# Patient Record
Sex: Male | Born: 1940 | Race: White | Hispanic: No | Marital: Married | Smoking: Never smoker
Health system: Southern US, Community
[De-identification: ages and names within clinical notes are randomized; demographics above are authoritative.]

## PROBLEM LIST (undated history)

## (undated) DIAGNOSIS — E119 Type 2 diabetes mellitus without complications: Secondary | ICD-10-CM

## (undated) DIAGNOSIS — G20 Parkinson's disease: Secondary | ICD-10-CM

## (undated) DIAGNOSIS — G20A1 Parkinson's disease without dyskinesia, without mention of fluctuations: Secondary | ICD-10-CM

---

## 2006-11-02 ENCOUNTER — Ambulatory Visit: Payer: Self-pay | Admitting: Gastroenterology

## 2006-11-06 ENCOUNTER — Observation Stay: Payer: Self-pay | Admitting: Internal Medicine

## 2006-11-06 ENCOUNTER — Other Ambulatory Visit: Payer: Self-pay

## 2007-10-22 ENCOUNTER — Ambulatory Visit: Payer: Self-pay | Admitting: Internal Medicine

## 2008-01-03 ENCOUNTER — Ambulatory Visit: Payer: Self-pay | Admitting: Internal Medicine

## 2008-09-06 ENCOUNTER — Ambulatory Visit: Payer: Self-pay | Admitting: Internal Medicine

## 2009-01-15 ENCOUNTER — Ambulatory Visit: Payer: Self-pay | Admitting: Ophthalmology

## 2009-03-28 ENCOUNTER — Inpatient Hospital Stay: Payer: Self-pay | Admitting: Internal Medicine

## 2009-06-17 ENCOUNTER — Emergency Department: Payer: Self-pay | Admitting: Emergency Medicine

## 2009-07-01 ENCOUNTER — Emergency Department: Payer: Self-pay | Admitting: Emergency Medicine

## 2010-04-04 ENCOUNTER — Ambulatory Visit: Payer: Self-pay | Admitting: Internal Medicine

## 2011-02-19 ENCOUNTER — Ambulatory Visit: Payer: Self-pay | Admitting: Internal Medicine

## 2011-05-16 ENCOUNTER — Ambulatory Visit: Payer: Self-pay | Admitting: Family Medicine

## 2012-12-05 ENCOUNTER — Ambulatory Visit: Payer: Self-pay | Admitting: Gastroenterology

## 2016-07-22 DIAGNOSIS — D239 Other benign neoplasm of skin, unspecified: Secondary | ICD-10-CM

## 2016-07-22 HISTORY — DX: Other benign neoplasm of skin, unspecified: D23.9

## 2018-04-21 ENCOUNTER — Emergency Department: Payer: Medicare HMO

## 2018-04-21 ENCOUNTER — Other Ambulatory Visit: Payer: Self-pay

## 2018-04-21 ENCOUNTER — Emergency Department
Admission: EM | Admit: 2018-04-21 | Discharge: 2018-04-21 | Disposition: A | Discharge status: Home / Self Care | Payer: Medicare HMO | Attending: Emergency Medicine | Admitting: Emergency Medicine

## 2018-04-21 DIAGNOSIS — R531 Weakness: Secondary | ICD-10-CM | POA: Insufficient documentation

## 2018-04-21 DIAGNOSIS — G20 Parkinson's disease: Secondary | ICD-10-CM | POA: Insufficient documentation

## 2018-04-21 HISTORY — DX: Parkinson's disease: G20

## 2018-04-21 HISTORY — DX: Parkinson's disease without dyskinesia, without mention of fluctuations: G20.A1

## 2018-04-21 LAB — URINALYSIS, COMPLETE (UACMP) WITH MICROSCOPIC
BACTERIA UA: NONE SEEN
BILIRUBIN URINE: NEGATIVE
Glucose, UA: NEGATIVE mg/dL
HGB URINE DIPSTICK: NEGATIVE
Ketones, ur: 5 mg/dL — AB
LEUKOCYTES UA: NEGATIVE
NITRITE: NEGATIVE
Protein, ur: NEGATIVE mg/dL
SPECIFIC GRAVITY, URINE: 1.023 (ref 1.005–1.030)
Squamous Epithelial / LPF: NONE SEEN (ref 0–5)
pH: 5 (ref 5.0–8.0)

## 2018-04-21 LAB — CBC
HCT: 42.9 % (ref 40.0–52.0)
HEMOGLOBIN: 15.1 g/dL (ref 13.0–18.0)
MCH: 30.9 pg (ref 26.0–34.0)
MCHC: 35.1 g/dL (ref 32.0–36.0)
MCV: 88 fL (ref 80.0–100.0)
Platelets: 181 10*3/uL (ref 150–440)
RBC: 4.88 MIL/uL (ref 4.40–5.90)
RDW: 13.7 % (ref 11.5–14.5)
WBC: 8.6 10*3/uL (ref 3.8–10.6)

## 2018-04-21 LAB — BASIC METABOLIC PANEL
ANION GAP: 6 (ref 5–15)
BUN: 18 mg/dL (ref 8–23)
CO2: 28 mmol/L (ref 22–32)
Calcium: 9.3 mg/dL (ref 8.9–10.3)
Chloride: 111 mmol/L (ref 98–111)
Creatinine, Ser: 0.83 mg/dL (ref 0.61–1.24)
Glucose, Bld: 98 mg/dL (ref 70–99)
Potassium: 3.6 mmol/L (ref 3.5–5.1)
SODIUM: 145 mmol/L (ref 135–145)

## 2018-04-21 LAB — TROPONIN I

## 2018-04-21 MED ORDER — SODIUM CHLORIDE 0.9 % IV BOLUS
500.0000 mL | Freq: Once | INTRAVENOUS | Status: AC
Start: 2018-04-21 — End: 2018-04-21
  Administered 2018-04-21: 500 mL via INTRAVENOUS

## 2018-04-21 NOTE — ED Notes (Signed)
Pt provided with ginger ale per request by family.

## 2018-04-21 NOTE — ED Triage Notes (Signed)
Pt arrives via [facility] from home c/o weakness and fatigue. Protocol initiated.

## 2018-04-21 NOTE — ED Notes (Signed)
Several attempts to call [facility] to inform of pt pending return, answering machine only.

## 2018-04-21 NOTE — ED Notes (Signed)
Vascular surgeon bedside at this time

## 2018-04-21 NOTE — ED Provider Notes (Addendum)
[facility] Emergency Department Provider Note  ____________________________________________   I have reviewed the triage vital signs and the nursing notes. Where available I have reviewed prior notes and, if possible and indicated, outside hospital notes.    HISTORY  Chief Complaint Weakness    HPI George Guerra is a 77 y.o. male  History of Parkinson's he has had gradual increase in his parkinsonian symptoms over the last few months, states he felt somewhat weak over the lunchtime.  He feels better now.  No focal weakness.  His review of systems is completely negative otherwise, no chest pain no shortness of breath no nausea no vomiting no abdominal pain no diarrhea no fall no head injury no focal numbness or weakness no stiff neck no abdominal pain no cough no change in medications no supplements no other complaints at all he just felt somewhat depleted today.  His strong preference is to go home.     Past Medical History:  Diagnosis Date  . Parkinson's disease ([place])     There are no active problems to display for this patient.   History reviewed. No pertinent surgical history.  Prior to Admission medications   Not on File    Allergies Patient has no known allergies.  History reviewed. No pertinent family history.  Social History Social History   Tobacco Use  . Smoking status: Never Smoker  . Smokeless tobacco: Never Used  Substance Use Topics  . Alcohol use: Never    Frequency: Never  . Drug use: Never    Review of Systems Constitutional: No fever/chills Eyes: No visual changes. ENT: No sore throat. No stiff neck no neck pain Cardiovascular: Denies chest pain. Respiratory: Denies shortness of breath. Gastrointestinal:   no vomiting.  No diarrhea.  No constipation. Genitourinary: Negative for dysuria. Musculoskeletal: Negative lower extremity swelling Skin: Negative for rash. Neurological: Negative for severe headaches, focal  weakness or numbness.   ____________________________________________   PHYSICAL EXAM:  VITAL SIGNS: ED Triage Vitals  Enc Vitals Group     BP 04/21/18 1617 (!) 160/83     Pulse Rate 04/21/18 1617 76     Resp 04/21/18 1617 16     Temp 04/21/18 1613 98.2 F (36.8 C)     Temp Source 04/21/18 1613 Oral     SpO2 04/21/18 1617 96 %     Weight 04/21/18 1613 180 lb (81.6 kg)     Height 04/21/18 1613 6' (1.829 m)     Head Circumference --      Peak Flow --      Pain Score 04/21/18 1613 0     Pain Loc --      Pain Edu? --      Excl. in [place]? --     Constitutional: Alert and oriented.  Mildly ill-appearing however in no acute distress. Eyes: Conjunctivae are normal Head: Atraumatic HEENT: No congestion/rhinnorhea. Mucous membranes are moist.  Oropharynx non-erythematous Neck:   Nontender with no meningismus, no masses, no stridor Cardiovascular: Normal rate, regular rhythm. Grossly normal heart sounds.  Good peripheral circulation. Respiratory: Normal respiratory effort.  No retractions. Lungs CTAB. Abdominal: Soft and nontender. No distention. No guarding no rebound Back:  There is no focal tenderness or step off.  there is no midline tenderness there are no lesions noted. there is no CVA tenderness Musculoskeletal: No lower extremity tenderness, no upper extremity tenderness. No joint effusions, no DVT signs strong distal pulses no edema Neurologic:  Normal speech and language. No  gross focal neurologic deficits are appreciated.  Skin:  Skin is warm, dry and intact. No rash noted. Psychiatric: Mood and affect are normal. Speech and behavior are normal.  ____________________________________________   LABS (all labs ordered are listed, but only abnormal results are displayed)  Labs Reviewed  URINALYSIS, COMPLETE (UACMP) WITH MICROSCOPIC - Abnormal; Notable for the following components:      Result Value   Color, Urine YELLOW (*)    APPearance CLEAR (*)    Ketones, ur 5 (*)     All other components within normal limits  BASIC METABOLIC PANEL  CBC    Pertinent labs  results that were available during my care of the patient were reviewed by me and considered in my medical decision making (see chart for details). ____________________________________________  EKG  I personally interpreted any EKGs ordered by me or triage Sinus rhythm rate 62 bpm no acute ST elevation or depression normal axis, nonspecific ST changes no acute ischemia.  Flip is in the inferior leads appear consistent with old. ____________________________________________  RADIOLOGY  Pertinent labs & imaging results that were available during my care of the patient were reviewed by me and considered in my medical decision making (see chart for details). If possible, patient and/or family made aware of any abnormal findings.  Dg Chest 2 View  Result Date: 04/21/2018 CLINICAL DATA:  Weakness, fatigue, Parkinson's disease EXAM: CHEST - 2 VIEW COMPARISON:  03/28/2009 FINDINGS: Normal heart size, mediastinal contours, and pulmonary vascularity. Atherosclerotic calcification aorta. Lungs clear. No pleural effusion or pneumothorax. Bones demineralized. IMPRESSION: No acute abnormalities. Electronically Signed   By: Lavonia Dana M.D.   On: 04/21/2018 17:45   Ct Head Wo Contrast  Result Date: 04/21/2018 CLINICAL DATA:  Weakness, fatigue EXAM: CT HEAD WITHOUT CONTRAST TECHNIQUE: Contiguous axial images were obtained from the base of the skull through the vertex without intravenous contrast. COMPARISON:  02/19/2011 FINDINGS: Brain: No acute territorial infarction, hemorrhage or intracranial mass. 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 Moderate atrophy. Mild small vessel ischemic changes of the white matter. Vascular: No hyperdense vessels.  Carotid vascular calcification Skull: Normal. Negative for fracture or focal lesion. Sinuses/Orbits: Near complete opacification of  right sphenoid sinus. No acute orbital abnormality Other: None IMPRESSION: 1. No CT evidence for acute intracranial abnormality. 2. Atrophy with small vessel ischemic changes of the white matter. 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   On: 04/21/2018 18:01   ____________________________________________    PROCEDURES  Procedure(s) performed: None  Procedures  Critical Care performed: None  ____________________________________________   INITIAL IMPRESSION / ASSESSMENT AND PLAN / ED COURSE  Pertinent labs & imaging results that were available during my care of the patient were reviewed by me and considered in my medical decision making (see chart for details).  Pt with no complaints no chest pain no shortness of breath no nausea no vomiting, just felt somewhat depleted today.  I think that if he did have chest pain he would know it, he certainly is  with it, he is alert he is oriented to the name date and year, and he is in no acute distress.  We will try to ambulate him.  EKG shows likely chronic changes troponin is pending, urinalysis and all the other work-up is negative including vital signs CT had chest x-ray.  If troponin is negative I think patient likely can  safely go home if he can ambulate we will try to ambulate him at this time.  Is requesting discharge and family feel strongly this would be the best thing for him.  We did discuss admission but they would rather go home.  I think this is not unreasonable.    ____________________________________________   FINAL CLINICAL IMPRESSION(S) / ED DIAGNOSES  Final diagnoses:  None      This chart was dictated using voice recognition software.  Despite best efforts to proofread,  errors can occur which can change meaning.      Schuyler Amor, MD 04/21/18 Remonia Richter    Schuyler Amor, MD 04/21/18 [phone]

## 2019-09-11 ENCOUNTER — Ambulatory Visit: Payer: Medicare HMO | Admitting: Neurology

## 2019-10-17 ENCOUNTER — Ambulatory Visit: Payer: Medicare HMO | Admitting: Neurology

## 2019-11-17 ENCOUNTER — Other Ambulatory Visit
Admission: RE | Admit: 2019-11-17 | Discharge: 2019-11-17 | Disposition: A | Discharge status: Home / Self Care | Payer: Medicare HMO | Source: Ambulatory Visit | Attending: Physician Assistant | Admitting: Physician Assistant

## 2019-11-17 ENCOUNTER — Other Ambulatory Visit: Payer: Self-pay

## 2019-11-17 ENCOUNTER — Ambulatory Visit
Admission: RE | Admit: 2019-11-17 | Discharge: 2019-11-17 | Disposition: A | Discharge status: Home / Self Care | Payer: Medicare HMO | Source: Ambulatory Visit | Attending: Physician Assistant | Admitting: Physician Assistant

## 2019-11-17 ENCOUNTER — Other Ambulatory Visit (HOSPITAL_COMMUNITY): Payer: Self-pay | Admitting: Physician Assistant

## 2019-11-17 ENCOUNTER — Other Ambulatory Visit: Payer: Self-pay | Admitting: Physician Assistant

## 2019-11-17 DIAGNOSIS — R55 Syncope and collapse: Secondary | ICD-10-CM

## 2019-11-17 DIAGNOSIS — R4781 Slurred speech: Secondary | ICD-10-CM

## 2019-11-17 LAB — TROPONIN I (HIGH SENSITIVITY): Troponin I (High Sensitivity): 9 ng/L (ref <18)

## 2020-01-18 ENCOUNTER — Ambulatory Visit: Payer: Medicare HMO | Admitting: Dermatology

## 2020-01-18 ENCOUNTER — Other Ambulatory Visit: Payer: Self-pay

## 2020-01-18 DIAGNOSIS — D229 Melanocytic nevi, unspecified: Secondary | ICD-10-CM

## 2020-01-18 DIAGNOSIS — L570 Actinic keratosis: Secondary | ICD-10-CM

## 2020-01-18 DIAGNOSIS — L820 Inflamed seborrheic keratosis: Secondary | ICD-10-CM

## 2020-01-18 DIAGNOSIS — L821 Other seborrheic keratosis: Secondary | ICD-10-CM | POA: Diagnosis not present

## 2020-01-18 DIAGNOSIS — Z1283 Encounter for screening for malignant neoplasm of skin: Secondary | ICD-10-CM | POA: Diagnosis not present

## 2020-01-18 DIAGNOSIS — L578 Other skin changes due to chronic exposure to nonionizing radiation: Secondary | ICD-10-CM

## 2020-01-18 DIAGNOSIS — D1800 Hemangioma unspecified site: Secondary | ICD-10-CM | POA: Diagnosis not present

## 2020-01-18 DIAGNOSIS — L814 Other melanin hyperpigmentation: Secondary | ICD-10-CM

## 2020-01-18 DIAGNOSIS — Z86018 Personal history of other benign neoplasm: Secondary | ICD-10-CM

## 2020-01-18 NOTE — Progress Notes (Signed)
   Follow-Up Visit   Subjective  George Guerra is a 79 y.o. male who presents for the following: Annual Exam (UBSE today - History of dysplastic nevus of left lat infrapectoral - excised 08/18/2016).  Patient presents for upper body skin exam for skin cancer screening and mole check.  The following portions of the chart were reviewed this encounter and updated as appropriate:  Tobacco  Allergies  Meds  Problems  Med Hx  Surg Hx  Fam Hx      Review of Systems:  No other skin or systemic complaints except as noted in HPI or Assessment and Plan.  Objective  Well appearing patient in no apparent distress; mood and affect are within normal limits.  All skin waist up examined.  Objective  Right lat brow: Erythematous thin papules/macules with gritty scale.   Objective  Left lat infrapectoral: Scar with no evidence of recurrence.   Objective  Right top of shoulder (4): Erythematous keratotic or waxy stuck-on papule or plaque.    Assessment & Plan    Skin cancer screening performed today.  Actinic Damage - diffuse scaly erythematous macules with underlying dyspigmentation - Recommend daily broad spectrum sunscreen SPF 30+ to sun-exposed areas, reapply every 2 hours as needed.  - Call for new or changing lesions.  Lentigines - Scattered tan macules - Discussed due to sun exposure - Benign, observe - Call for any changes  Seborrheic Keratoses - Stuck-on, waxy, tan-brown papules and plaques  - Discussed benign etiology and prognosis. - Observe - Call for any changes  Hemangiomas - Red papules - Discussed benign nature - Observe - Call for any changes  Melanocytic Nevi - Tan-brown and/or pink-flesh-colored symmetric macules and papules - Benign appearing on exam today - Observation - Call clinic for new or changing moles - Recommend daily use of broad spectrum spf 30+ sunscreen to sun-exposed areas.    AK (actinic keratosis) Right lat brow  Destruction  of lesion - Right lat brow Complexity: simple   Destruction method: cryotherapy   Informed consent: discussed and consent obtained   Timeout:  patient name, date of birth, surgical site, and procedure verified Lesion destroyed using liquid nitrogen: Yes   Region frozen until ice ball extended beyond lesion: Yes   Outcome: patient tolerated procedure well with no complications   Post-procedure details: wound care instructions given    History of dysplastic nevus Left lat infrapectoral  Clear today. Observe   Inflamed seborrheic keratosis (4) Right top of shoulder  Destruction of lesion - Right top of shoulder Complexity: simple   Destruction method: cryotherapy   Informed consent: discussed and consent obtained   Timeout:  patient name, date of birth, surgical site, and procedure verified Lesion destroyed using liquid nitrogen: Yes   Region frozen until ice ball extended beyond lesion: Yes   Outcome: patient tolerated procedure well with no complications   Post-procedure details: wound care instructions given    Return in about 1 year (around 01/17/2021).   I, Ashok Cordia, CMA, am acting as scribe for Sarina Ser, MD .     Documentation: I have reviewed the above documentation for accuracy and completeness, and I agree with the above.  Sarina Ser, MD

## 2020-01-18 NOTE — Patient Instructions (Signed)

## 2020-01-19 ENCOUNTER — Encounter: Payer: Self-pay | Admitting: Dermatology

## 2020-04-19 ENCOUNTER — Other Ambulatory Visit
Admission: RE | Admit: 2020-04-19 | Discharge: 2020-04-19 | Disposition: A | Discharge status: Home / Self Care | Payer: Medicare HMO | Source: Ambulatory Visit | Attending: Infectious Diseases | Admitting: Infectious Diseases

## 2020-04-19 DIAGNOSIS — R0602 Shortness of breath: Secondary | ICD-10-CM | POA: Diagnosis not present

## 2020-04-19 DIAGNOSIS — I10 Essential (primary) hypertension: Secondary | ICD-10-CM | POA: Diagnosis not present

## 2020-04-19 DIAGNOSIS — I483 Typical atrial flutter: Secondary | ICD-10-CM | POA: Insufficient documentation

## 2020-04-19 DIAGNOSIS — G20 Parkinson's disease: Secondary | ICD-10-CM | POA: Diagnosis not present

## 2020-04-19 DIAGNOSIS — R55 Syncope and collapse: Secondary | ICD-10-CM | POA: Diagnosis not present

## 2020-04-19 LAB — BRAIN NATRIURETIC PEPTIDE: B Natriuretic Peptide: 72.4 pg/mL (ref 0.0–100.0)

## 2020-05-20 ENCOUNTER — Emergency Department
Admission: EM | Admit: 2020-05-20 | Discharge: 2020-05-20 | Disposition: A | Discharge status: Home / Self Care | Payer: Medicare HMO | Attending: Emergency Medicine | Admitting: Emergency Medicine

## 2020-05-20 ENCOUNTER — Other Ambulatory Visit: Payer: Self-pay

## 2020-05-20 ENCOUNTER — Encounter: Payer: Self-pay | Admitting: Emergency Medicine

## 2020-05-20 DIAGNOSIS — R0602 Shortness of breath: Secondary | ICD-10-CM | POA: Insufficient documentation

## 2020-05-20 DIAGNOSIS — Z5321 Procedure and treatment not carried out due to patient leaving prior to being seen by health care provider: Secondary | ICD-10-CM | POA: Insufficient documentation

## 2020-05-20 HISTORY — DX: Type 2 diabetes mellitus without complications: E11.9

## 2020-05-20 LAB — CBC WITH DIFFERENTIAL/PLATELET
Abs Immature Granulocytes: 0.05 10*3/uL (ref 0.00–0.07)
Basophils Absolute: 0.1 10*3/uL (ref 0.0–0.1)
Basophils Relative: 1 %
Eosinophils Absolute: 0.1 10*3/uL (ref 0.0–0.5)
Eosinophils Relative: 2 %
HCT: 47.5 % (ref 39.0–52.0)
Hemoglobin: 16.5 g/dL (ref 13.0–17.0)
Immature Granulocytes: 1 %
Lymphocytes Relative: 21 %
Lymphs Abs: 2 10*3/uL (ref 0.7–4.0)
MCH: 30.5 pg (ref 26.0–34.0)
MCHC: 34.7 g/dL (ref 30.0–36.0)
MCV: 87.8 fL (ref 80.0–100.0)
Monocytes Absolute: 0.7 10*3/uL (ref 0.1–1.0)
Monocytes Relative: 7 %
Neutro Abs: 6.4 10*3/uL (ref 1.7–7.7)
Neutrophils Relative %: 68 %
Platelets: 183 10*3/uL (ref 150–400)
RBC: 5.41 MIL/uL (ref 4.22–5.81)
RDW: 12.8 % (ref 11.5–15.5)
WBC: 9.3 10*3/uL (ref 4.0–10.5)
nRBC: 0 % (ref 0.0–0.2)

## 2020-05-20 LAB — COMPREHENSIVE METABOLIC PANEL
ALT: <5 U/L (ref 0–44)
AST: 16 U/L (ref 15–41)
Albumin: 4.1 g/dL (ref 3.5–5.0)
Alkaline Phosphatase: 68 U/L (ref 38–126)
Anion gap: 8 (ref 5–15)
BUN: 15 mg/dL (ref 8–23)
CO2: 27 mmol/L (ref 22–32)
Calcium: 9.2 mg/dL (ref 8.9–10.3)
Chloride: 101 mmol/L (ref 98–111)
Creatinine, Ser: 1.19 mg/dL (ref 0.61–1.24)
GFR calc Af Amer: >60 mL/min (ref >60)
GFR calc non Af Amer: 57 mL/min — ABNORMAL LOW (ref >60)
Glucose, Bld: 250 mg/dL — ABNORMAL HIGH (ref 70–99)
Potassium: 3.5 mmol/L (ref 3.5–5.1)
Sodium: 136 mmol/L (ref 135–145)
Total Bilirubin: 1.4 mg/dL — ABNORMAL HIGH (ref 0.3–1.2)
Total Protein: 7.3 g/dL (ref 6.5–8.1)

## 2020-05-20 LAB — TROPONIN I (HIGH SENSITIVITY): Troponin I (High Sensitivity): 8 ng/L (ref <18)

## 2020-05-20 NOTE — ED Triage Notes (Signed)
EMS brought pt in from home for [facility]

## 2020-05-20 NOTE — ED Triage Notes (Signed)
Pt arrived to ED via EMS from home with c/o intermittent SOB for the past 2 days and A-fib with a  hx of the same. Pt currently answering questions with no increased work of breathing noted. Moments  Of tachypnea but pt states the mask makes him feel like he is having trouble breathing. Pt appears comfortable with no acute distress noted. Pt denies chest pain. O2 saturation with EMS 97-98% on room air.

## 2020-05-22 ENCOUNTER — Telehealth: Payer: Self-pay | Admitting: Emergency Medicine

## 2020-05-22 NOTE — Telephone Encounter (Signed)
Called patient due to lwot to inquire about condition and follow up plans. No answer on home or mobile number and no voicmail.

## 2020-06-20 ENCOUNTER — Other Ambulatory Visit: Payer: Self-pay

## 2020-06-20 ENCOUNTER — Emergency Department: Payer: Medicare HMO

## 2020-06-20 DIAGNOSIS — R0602 Shortness of breath: Secondary | ICD-10-CM | POA: Insufficient documentation

## 2020-06-20 DIAGNOSIS — Z5321 Procedure and treatment not carried out due to patient leaving prior to being seen by health care provider: Secondary | ICD-10-CM | POA: Diagnosis not present

## 2020-06-20 DIAGNOSIS — R531 Weakness: Secondary | ICD-10-CM | POA: Insufficient documentation

## 2020-06-20 LAB — BASIC METABOLIC PANEL
Anion gap: 12 (ref 5–15)
BUN: 17 mg/dL (ref 8–23)
CO2: 26 mmol/L (ref 22–32)
Calcium: 9.8 mg/dL (ref 8.9–10.3)
Chloride: 100 mmol/L (ref 98–111)
Creatinine, Ser: 1.14 mg/dL (ref 0.61–1.24)
GFR calc Af Amer: >60 mL/min (ref >60)
GFR calc non Af Amer: 60 mL/min — ABNORMAL LOW (ref >60)
Glucose, Bld: 165 mg/dL — ABNORMAL HIGH (ref 70–99)
Potassium: 3.9 mmol/L (ref 3.5–5.1)
Sodium: 138 mmol/L (ref 135–145)

## 2020-06-20 LAB — URINALYSIS, COMPLETE (UACMP) WITH MICROSCOPIC
Glucose, UA: NEGATIVE mg/dL
Hgb urine dipstick: NEGATIVE
Ketones, ur: 5 mg/dL — AB
Leukocytes,Ua: NEGATIVE
Nitrite: NEGATIVE
Protein, ur: 30 mg/dL — AB
Specific Gravity, Urine: 1.032 — ABNORMAL HIGH (ref 1.005–1.030)
pH: 5 (ref 5.0–8.0)

## 2020-06-20 LAB — CBC
HCT: 47.5 % (ref 39.0–52.0)
Hemoglobin: 17 g/dL (ref 13.0–17.0)
MCH: 30.3 pg (ref 26.0–34.0)
MCHC: 35.8 g/dL (ref 30.0–36.0)
MCV: 84.7 fL (ref 80.0–100.0)
Platelets: 220 10*3/uL (ref 150–400)
RBC: 5.61 MIL/uL (ref 4.22–5.81)
RDW: 13.2 % (ref 11.5–15.5)
WBC: 10.8 10*3/uL — ABNORMAL HIGH (ref 4.0–10.5)
nRBC: 0 % (ref 0.0–0.2)

## 2020-06-20 LAB — TROPONIN I (HIGH SENSITIVITY): Troponin I (High Sensitivity): 8 ng/L (ref <18)

## 2020-06-20 NOTE — ED Notes (Signed)
BIB [facility] from home c/o weakness. Pt was seen by cardiology today for SOB and discharged with new oxygen. VSS, CBG 133.

## 2020-06-20 NOTE — ED Triage Notes (Signed)
Pt here after going to the cardiologist and was weak and lethargic. Pt's daughter also states that he was SOB and diaphoretic. Daughter also states that urine has a foul smell as well. Pt normally can walk some but does have hx of Parkinson's.

## 2020-06-21 ENCOUNTER — Other Ambulatory Visit: Payer: Self-pay | Admitting: Infectious Diseases

## 2020-06-21 ENCOUNTER — Other Ambulatory Visit: Payer: Self-pay

## 2020-06-21 ENCOUNTER — Emergency Department
Admission: RE | Admit: 2020-06-21 | Discharge: 2020-06-21 | Disposition: A | Discharge status: Home / Self Care | Payer: Medicare HMO | Source: Ambulatory Visit | Attending: Infectious Diseases | Admitting: Infectious Diseases

## 2020-06-21 ENCOUNTER — Emergency Department
Admission: EM | Admit: 2020-06-21 | Discharge: 2020-06-21 | Disposition: A | Discharge status: Home / Self Care | Payer: Medicare HMO | Attending: Infectious Diseases | Admitting: Infectious Diseases

## 2020-06-21 DIAGNOSIS — R531 Weakness: Secondary | ICD-10-CM

## 2020-07-22 DEATH — deceased

## 2021-04-09 IMAGING — CT CT HEAD W/O CM
3 series · 16 of 47 positions shown, 19 images · non-contrast
Comparison: CT head 11/17/2019

CLINICAL DATA: Left-sided weakness.  Parkinson's.

EXAM:
CT HEAD WITHOUT CONTRAST
TECHNIQUE: Contiguous axial images were obtained from the base of the skull
through the vertex without intravenous contrast.

[Series 2: head wo · axial · 0.41mm/px · z∈[-109,+16]mm · 10 of 31 slices shown, 13 images]
[im 3/31  brain]
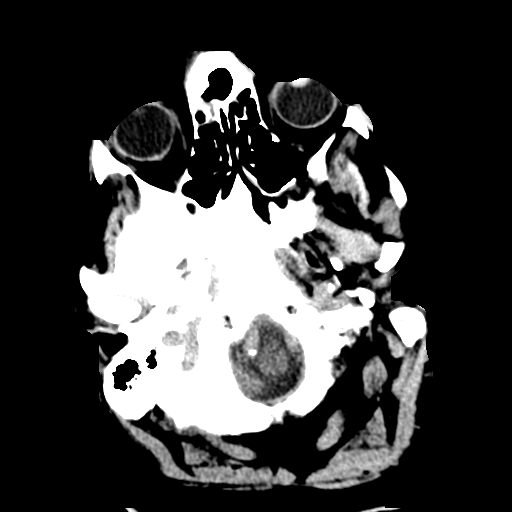
[im 3/31  bone]
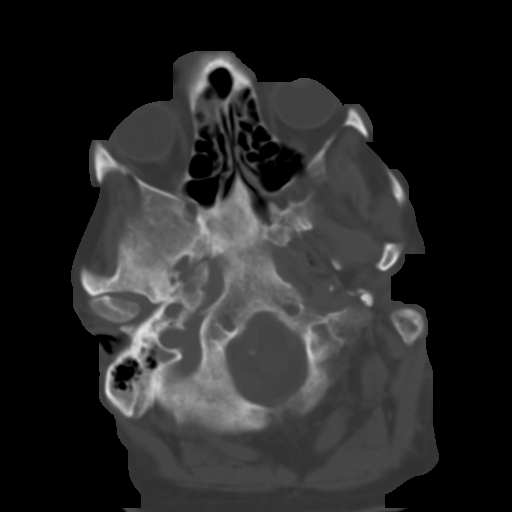
[im 6/31  brain]
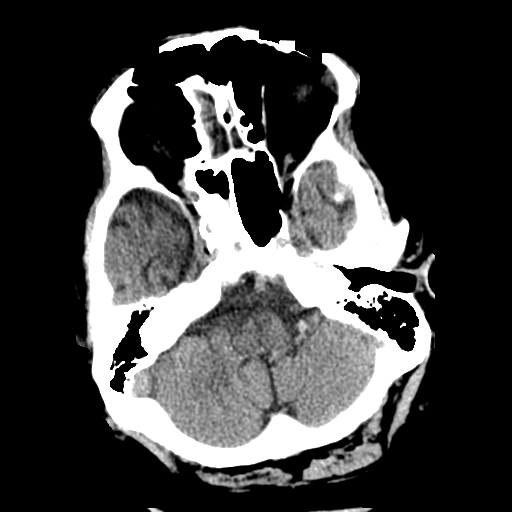
[im 9/31  brain]
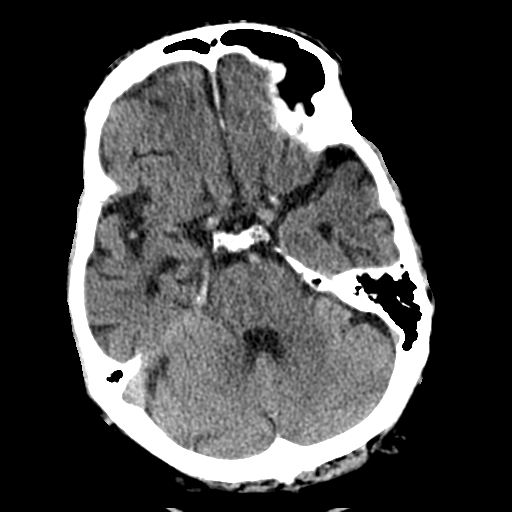
[im 11/31  brain]
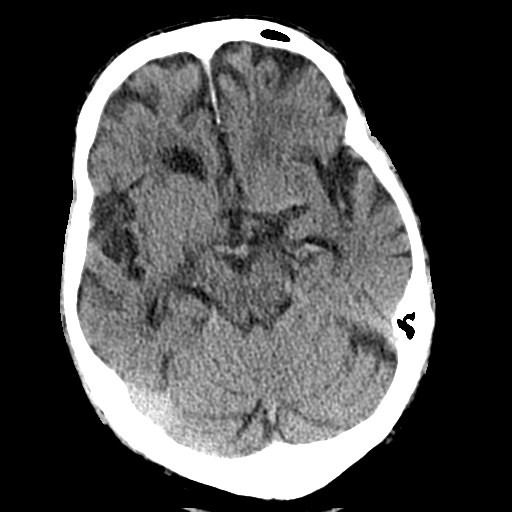
[im 14/31  brain]
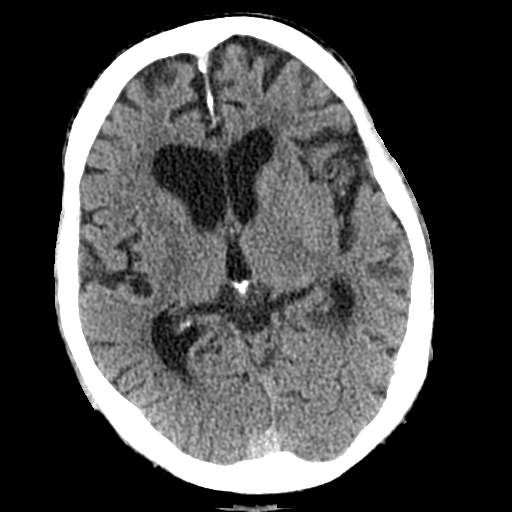
[im 14/31  bone]
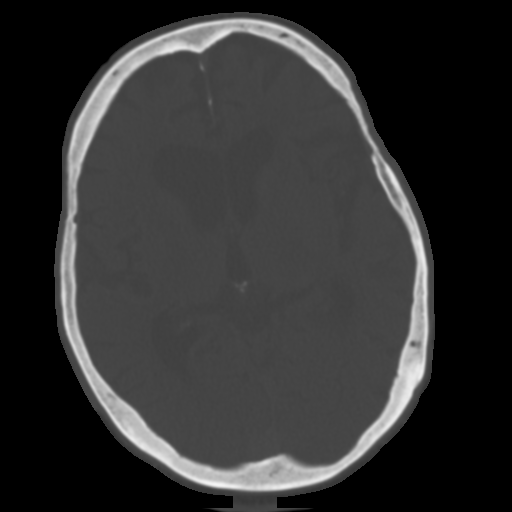
[im 17/31  brain]
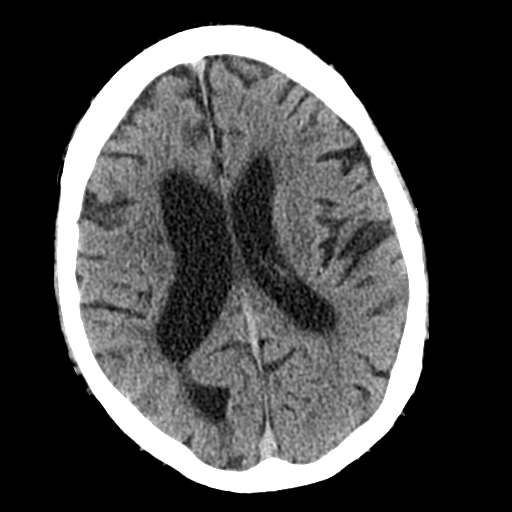
[im 20/31  brain]
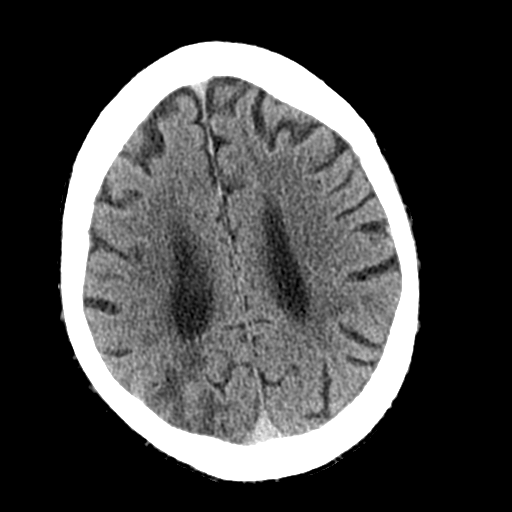
[im 23/31  brain]
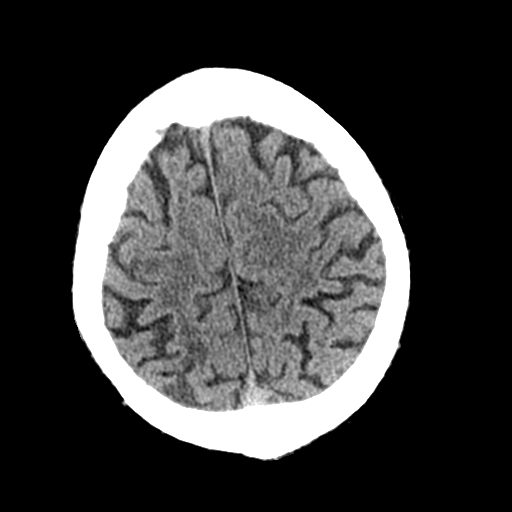
[im 25/31  brain]
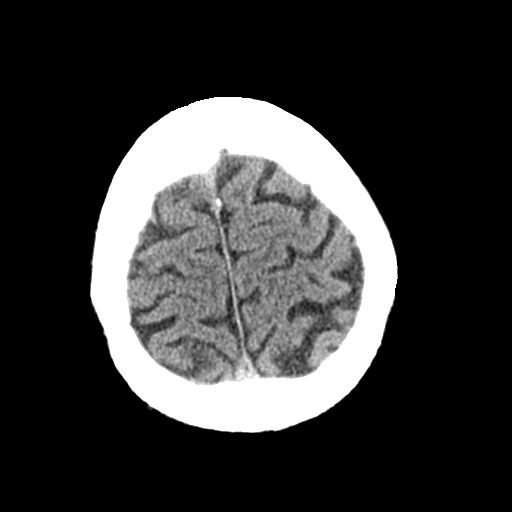
[im 25/31  bone]
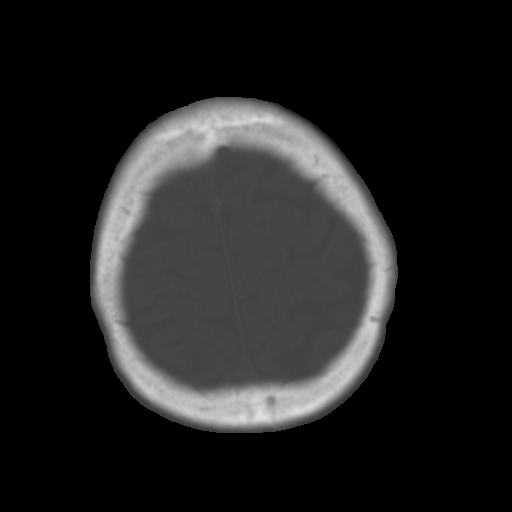
[im 28/31  brain]
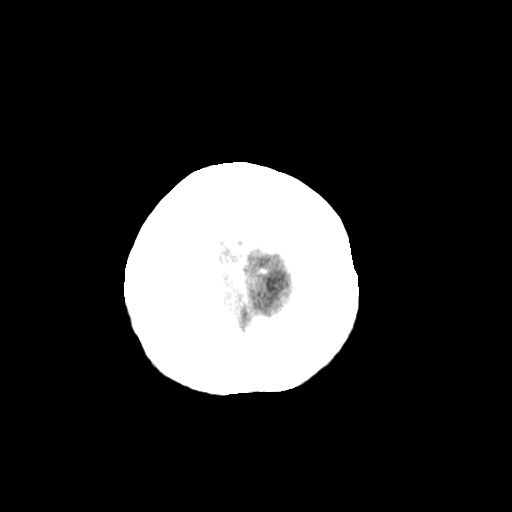

[Series 4: coronal soft tissue · coronal · 0.31mm/px · 3 of 70 slices shown]
[im 24/70  brain]
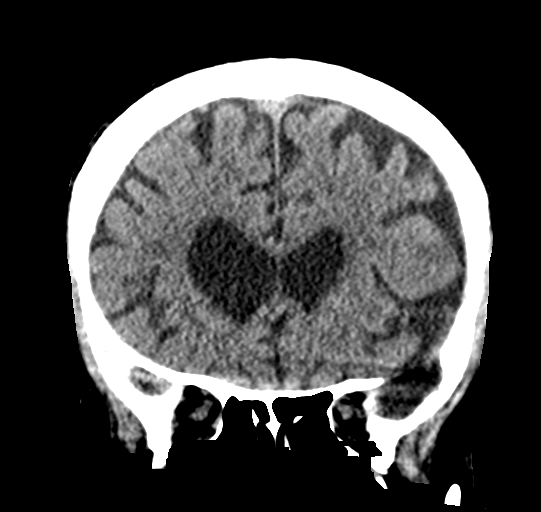
[im 31/70  brain]
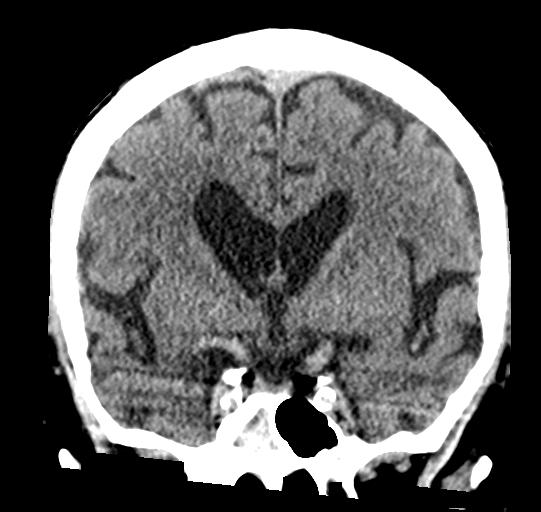
[im 39/70  brain]
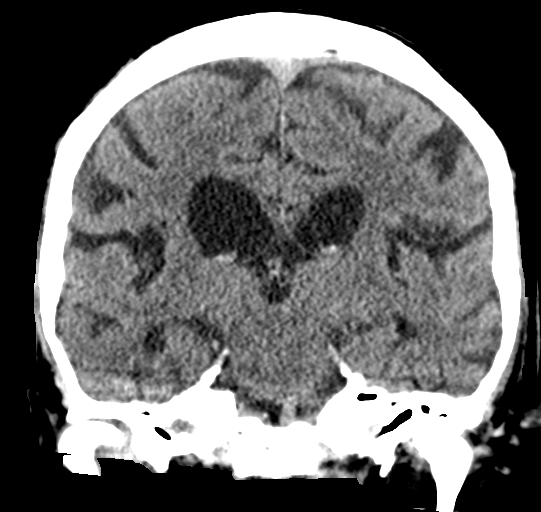

[Series 5: sagittal soft tissue · sagittal · 0.31mm/px · 3 of 61 slices shown]
[im 21/61  brain]
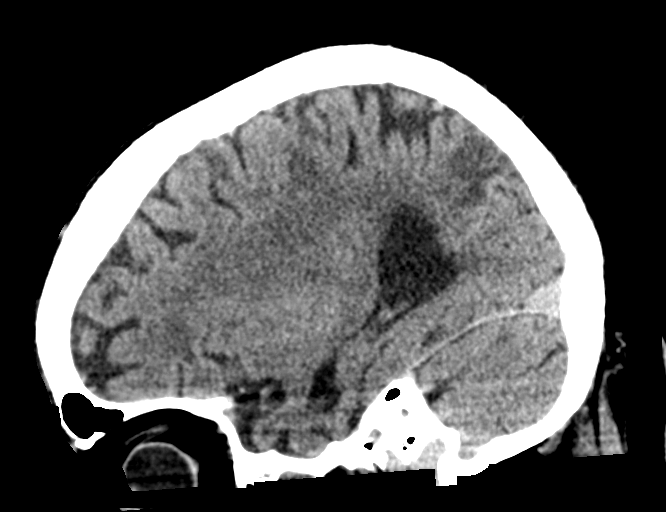
[im 31/61  brain]
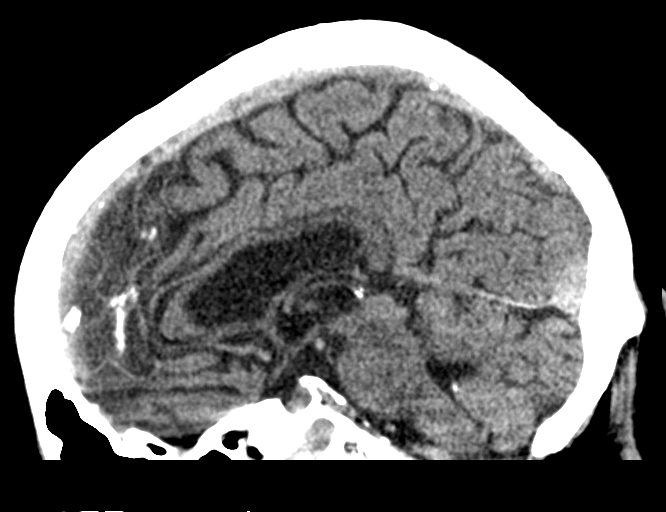
[im 41/61  brain]
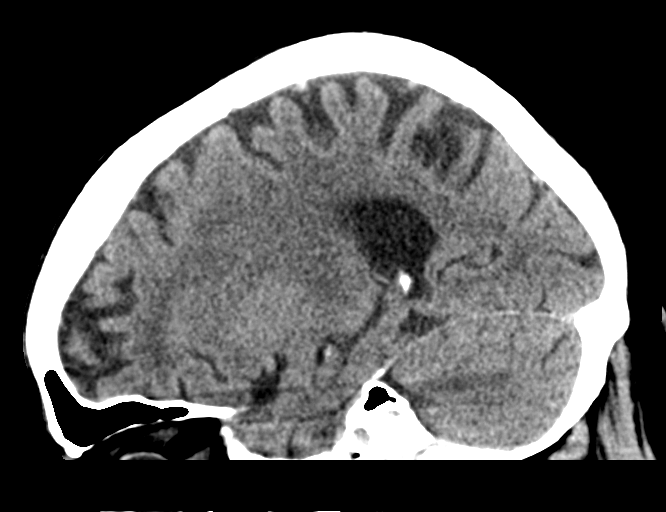

[16 of 47 positions shown; findings below may reference images not displayed]

FINDINGS: Brain: New area of ill-defined cortical hypodensity in the right
parietal lobe compatible with acute or subacute infarct. No
associated hemorrhage or mass.

Generalized atrophy without hydrocephalus. Mild periventricular
white matter hypodensity is unchanged. No acute hemorrhage.

Vascular: Negative for hyperdense vessel

Skull: Negative

Sinuses/Orbits: Mucosal edema and chronic bony thickening right
sphenoid sinus. Remaining sinuses clear. Negative orbit.

Other: None
IMPRESSION: New cortical hypodensity right parietal lobe compatible with acute
infarct. No acute hemorrhage.

Atrophy and chronic microvascular ischemic change.
